# Patient Record
Sex: Female | Born: 1980 | Race: Black or African American | Hispanic: No | Marital: Single | Smoking: Light tobacco smoker
Health system: Southern US, Community
[De-identification: ages and names within clinical notes are randomized; demographics above are authoritative.]

## PROBLEM LIST (undated history)

## (undated) DIAGNOSIS — I10 Essential (primary) hypertension: Secondary | ICD-10-CM

## (undated) DIAGNOSIS — N6452 Nipple discharge: Secondary | ICD-10-CM

## (undated) HISTORY — PX: TUBAL LIGATION: SHX77

## (undated) HISTORY — PX: TONSILLECTOMY: SUR1361

---

## 2012-11-22 ENCOUNTER — Encounter (HOSPITAL_COMMUNITY): Payer: Self-pay | Admitting: Emergency Medicine

## 2012-11-22 ENCOUNTER — Emergency Department (HOSPITAL_COMMUNITY)
Admission: EM | Admit: 2012-11-22 | Discharge: 2012-11-22 | Disposition: A | Discharge status: Home / Self Care | Payer: Medicaid Other | Attending: Emergency Medicine | Admitting: Emergency Medicine

## 2012-11-22 DIAGNOSIS — F17200 Nicotine dependence, unspecified, uncomplicated: Secondary | ICD-10-CM | POA: Insufficient documentation

## 2012-11-22 DIAGNOSIS — R05 Cough: Secondary | ICD-10-CM | POA: Insufficient documentation

## 2012-11-22 DIAGNOSIS — R059 Cough, unspecified: Secondary | ICD-10-CM | POA: Insufficient documentation

## 2012-11-22 DIAGNOSIS — R220 Localized swelling, mass and lump, head: Secondary | ICD-10-CM | POA: Insufficient documentation

## 2012-11-22 DIAGNOSIS — R040 Epistaxis: Secondary | ICD-10-CM | POA: Insufficient documentation

## 2012-11-22 DIAGNOSIS — J029 Acute pharyngitis, unspecified: Secondary | ICD-10-CM | POA: Insufficient documentation

## 2012-11-22 DIAGNOSIS — I10 Essential (primary) hypertension: Secondary | ICD-10-CM | POA: Insufficient documentation

## 2012-11-22 DIAGNOSIS — J069 Acute upper respiratory infection, unspecified: Secondary | ICD-10-CM | POA: Insufficient documentation

## 2012-11-22 DIAGNOSIS — J3489 Other specified disorders of nose and nasal sinuses: Secondary | ICD-10-CM | POA: Insufficient documentation

## 2012-11-22 MED ORDER — SALINE SPRAY 0.65 % NA SOLN: 1.0000 | NASAL | Status: AC | PRN

## 2012-11-22 MED ORDER — OXYMETAZOLINE HCL 0.05 % NA SOLN: NASAL | Status: AC

## 2012-11-22 MED ORDER — OXYMETAZOLINE HCL 0.05 % NA SOLN
1.0000 | Freq: Once | NASAL | Status: AC
Start: 2012-11-22 — End: 2012-11-22
  Administered 2012-11-22: 1 via NASAL
  Filled 2012-11-22: qty 15

## 2012-11-22 MED ORDER — COCAINE HCL 4 % EX SOLN
4.0000 mL | Freq: Once | CUTANEOUS | Status: DC
  Filled 2012-11-22: qty 4

## 2012-11-22 MED ORDER — HYDROCODONE-HOMATROPINE 5-1.5 MG/5ML PO SYRP: 2.5000 mL | ORAL_SOLUTION | Freq: Four times a day (QID) | ORAL | Status: AC | PRN

## 2012-11-22 NOTE — ED Notes (Signed)
Pt states she has been sick for the past 3 days and has been having nose bleeds. 1 today right before coming to ER and 3 yesterday.

## 2012-11-22 NOTE — ED Provider Notes (Signed)
CSN: [id number]     Arrival date & time 11/22/12  1200 History   First MD Initiated Contact with Patient 11/22/12 1216     Chief Complaint  Patient presents with  . Nasal Congestion  . Epistaxis   (Consider location/radiation/quality/duration/timing/severity/associated sxs/prior Treatment) HPI 32 year old female presents to the emergency department chief complaint of upper respiratory infection and epistaxis.  The patient states that she has had a URI over the past 3 days with symptoms of eyes, productive cough, sore throat.  Patient has been taking Robitussin-DM and TheraFlu at home.  She had 3 episodes of epistaxis yesterday which resolved with pressure.  This morning the patient woke up and had another nosebleed which did not resolve at home with pressure prompted her visit to the emergency department.  Patient has a history of epistaxis as a child.  She does note some bruising on her skin but denies heavy bleeding with her periods.  She does not have a history of coagulation disorder.  Patient denies sinus tenderness but does have pressure in her nasal cavity.  History reviewed. No pertinent past medical history. History reviewed. No pertinent past surgical history. No family history on file. History  Substance Use Topics  . Smoking status: Current Every Day Smoker  . Smokeless tobacco: Not on file  . Alcohol Use: No   OB History   Grav Para Term Preterm Abortions TAB SAB Ect Mult Living                 Review of Systems  Constitutional: Negative for fever and chills.  HENT: Positive for congestion, sore throat, facial swelling and rhinorrhea. Negative for trouble swallowing and voice change.   Respiratory: Positive for cough. Negative for chest tightness and shortness of breath.   Cardiovascular: Negative for chest pain.  Gastrointestinal: Negative for nausea, vomiting, abdominal pain, diarrhea and constipation.  Genitourinary: Negative for dysuria and hematuria.   Musculoskeletal: Negative for myalgias and arthralgias.  Skin: Negative for rash.  Neurological: Negative for numbness.  Hematological:       Epistaxis  All other systems reviewed and are negative.    Allergies  Review of patient's allergies indicates no known allergies.  Home Medications   Current Outpatient Rx  Name  Route  Sig  Dispense  Refill  . guaiFENesin-dextromethorphan (ROBITUSSIN DM) 100-10 MG/5ML syrup   Oral   Take 5 mLs by mouth 3 (three) times daily as needed for cough.         . Pheniramine-PE-APAP (THERAFLU FLU & SORE THROAT) 20-10-650 MG PACK   Oral   Take 1 packet by mouth every 6 (six) hours as needed (cold symptoms).         Marland Kitchen HYDROcodone-homatropine (HYCODAN) 5-1.5 MG/5ML syrup   Oral   Take 2.5 mLs by mouth every 6 (six) hours as needed for cough.   30 mL   0   . oxymetazoline (AFRIN NASAL SPRAY) 0.05 % nasal spray      Please 2 sprays in each nostril twice a day for the next 2 days.  Discontinue the use after that.   15 mL   0   . sodium chloride (OCEAN) 0.65 % SOLN nasal spray   Nasal   Place 1 spray into the nose as needed for congestion.   15 mL   0    BP 134/100  Pulse 76  Temp(Src) 98.6 F (37 C) (Oral)  Resp 20  SpO2 99%  LMP 11/16/2012 Physical Exam Appears moderately ill but  not toxic; temperature as noted in vitals. Ears normal. Eyes:glassy appearance, no discharge  Heart: RRR, NO M/G/R Throat and pharynx normal.   Neck supple. No adenopathyhy in the neck.  Nose with red clots.  She has multiple dilated vasculature in the nasal cavity. No active bleeding.  Sinuses non tender.  The chest is clear. Abdomen is soft and nontender  ED Course  Procedures (including critical care time) Labs Review Labs Reviewed - No data to display Imaging Review No results found.  MDM   1. URI (upper respiratory infection)   2. Epistaxis   3. HTN (hypertension)    Patient here with hypertension, epistaxis and symptoms of URI.   She is afebrile and denies fevers at home.  The patient initially had resolution of her epistaxis however I did discharge began bleeding again.  I didn't use 2 sprays of Afrin in each nasal cavity with another 20 minutes of apply pressure.  Patient again had resolution of her symptoms however she blew her nose was a large clot and then began bleeding again.  At this point I had Dr. Jonny Ruiz knapp, and evaluate the patient.  She continues to have bleeding and no bruising she denied knowing what site her bleeding was calm being from states he thinks it's from the left side now. I placed 4% cocaine solution in each nostril and will reevaluate the patient after which she will get rapid Rhino placement in the nose for resolution of her epistaxis.   The patient did have resolution of her epistaxis with the cocaine solution in the nasal cavity.  I did attempt to place a rapid Rhino posterior nasal catheter however was unable to successfully Foley moved the catheter to the posterior pharynx I assume because of swelling in the nasal cavity.  I did ask the patient if he did try secondarily however she stated that she did not want to attempt this again.  The patient will be discharged home with after N.  She should follow up with the ear nose and throat doctor regarding her epistaxis.  I discussed return precautions for the patient.  Date of discharge  Arthor Captain, PA-C 11/22/12 1542

## 2012-11-22 NOTE — ED Provider Notes (Signed)
Medical screening examination/treatment/procedure(s) were performed by non-physician practitioner and as supervising physician I was immediately available for consultation/collaboration.    Celene Kras, MD 11/22/12 [phone]

## 2015-08-31 ENCOUNTER — Other Ambulatory Visit: Payer: Self-pay

## 2015-08-31 DIAGNOSIS — J020 Streptococcal pharyngitis: Secondary | ICD-10-CM | POA: Diagnosis not present

## 2015-08-31 DIAGNOSIS — F17200 Nicotine dependence, unspecified, uncomplicated: Secondary | ICD-10-CM | POA: Insufficient documentation

## 2015-08-31 DIAGNOSIS — R000 Tachycardia, unspecified: Secondary | ICD-10-CM | POA: Diagnosis not present

## 2015-08-31 DIAGNOSIS — J029 Acute pharyngitis, unspecified: Secondary | ICD-10-CM | POA: Diagnosis present

## 2015-08-31 DIAGNOSIS — M25511 Pain in right shoulder: Secondary | ICD-10-CM | POA: Diagnosis not present

## 2015-09-01 ENCOUNTER — Encounter (HOSPITAL_COMMUNITY): Payer: Self-pay

## 2015-09-01 ENCOUNTER — Emergency Department (HOSPITAL_COMMUNITY)
Admission: EM | Admit: 2015-09-01 | Discharge: 2015-09-01 | Disposition: A | Discharge status: Home / Self Care | Payer: 59 | Attending: Emergency Medicine | Admitting: Emergency Medicine

## 2015-09-01 DIAGNOSIS — J020 Streptococcal pharyngitis: Secondary | ICD-10-CM

## 2015-09-01 LAB — RAPID STREP SCREEN (MED CTR MEBANE ONLY): STREPTOCOCCUS, GROUP A SCREEN (DIRECT): POSITIVE — AB

## 2015-09-01 MED ORDER — PENICILLIN G BENZATHINE 1200000 UNIT/2ML IM SUSP
1.2000 10*6.[IU] | Freq: Once | INTRAMUSCULAR | Status: AC
  Administered 2015-09-01: 1.2 10*6.[IU] via INTRAMUSCULAR
  Filled 2015-09-01: qty 2

## 2015-09-01 MED ORDER — NAPROXEN 250 MG PO TABS
500.0000 mg | ORAL_TABLET | Freq: Once | ORAL | Status: AC
  Administered 2015-09-01: 500 mg via ORAL
  Filled 2015-09-01: qty 2

## 2015-09-01 MED ORDER — IBUPROFEN 600 MG PO TABS: 600.0000 mg | ORAL_TABLET | Freq: Four times a day (QID) | ORAL | Status: AC | PRN

## 2015-09-01 MED ORDER — MAGIC MOUTHWASH: 5.0000 mL | Freq: Three times a day (TID) | ORAL | Status: AC | PRN

## 2015-09-01 NOTE — ED Notes (Signed)
THE PT IS HYPERVENTILATING   AND BREATHING 30 TIMES A MINUTE.  SHE IS NOT ANSWERING QUESTIONS.  THE FEMALE WITH HER IS ANSWERING FOR HER    She nodes or shakes her head to yes and no questions..  When asked if she had panic attacks or anxiety  She shook her head no  But the female with her nodded to yes.  The pt kept her eyes closed. She started not talking at dinner time.  C/o throat and shoulder pain rt leg pain

## 2015-09-01 NOTE — ED Provider Notes (Signed)
CSN: [id number]     Arrival date & time 08/31/15  2351 History  By signing my name below, I, [facility], attest that this documentation has been prepared under the direction and in the presence of Derwood Kaplan, MD. Electronically Signed: Randell Patient, ED Scribe. 09/01/2015. 3:12 AM.   Chief Complaint  Patient presents with  . Shortness of Breath  . Sore Throat    The history is provided by the patient and a significant other. No language interpreter was used.   HPI Comments: Andrea Chen is a 35 y.o. female who presents to the Emergency Department complaining of constant, moderate sore throat onset 23 hours ago. History is provided by a significant other and pt. Significant other reports that the pt complained of right ear pain, followed by right-sided neck pain, right shoulder pain. Pt reports that she woke with a sore throat followed by pain with swallowing. She reports one episode of left-sided CP only - otherwise her pain has been on the right side of the chest and shoulder region and the episodes last for five minutes. Pain is unchanged with exertion, breathing. She denies recent sick contact but notes that she does have three children, one of which had strep throat 1 month ago. Denies hx of DM, DVT, and PE.  Denies congestion, cough, fevers, or any other symptoms currently.  Significant other reports that the pt complained of heart palpitations. He states that that the pt became dizzy and fatigued after which she measured her BP noting that it was elevated. Denies hx of HTN or CAD. Denies similar symptoms in the past. Denies taking HTN medications. Denies any other symptoms currently other than those noted previously.  History reviewed. No pertinent past medical history. History reviewed. No pertinent past surgical history. History reviewed. No pertinent family history. Social History  Substance Use Topics  . Smoking status: Current Every Day Smoker  . Smokeless tobacco:  None  . Alcohol Use: No   OB History    No data available     Review of Systems A complete 10 system review of systems was obtained and all systems are negative except as noted in the HPI and PMH.    Allergies  Review of patient's allergies indicates no known allergies.  Home Medications   Prior to Admission medications   Medication Sig Start Date End Date Taking? Authorizing Provider  guaiFENesin-dextromethorphan (ROBITUSSIN DM) 100-10 MG/5ML syrup Take 5 mLs by mouth 3 (three) times daily as needed for cough.    Historical Provider, MD  HYDROcodone-homatropine (HYCODAN) 5-1.5 MG/5ML syrup Take 2.5 mLs by mouth every 6 (six) hours as needed for cough. 11/22/12   Arthor Captain, PA-C  ibuprofen (ADVIL,MOTRIN) 600 MG tablet Take 1 tablet (600 mg total) by mouth every 6 (six) hours as needed. 09/01/15   Derwood Kaplan, MD  magic mouthwash SOLN Take 5 mLs by mouth 3 (three) times daily as needed for mouth pain. 09/01/15   Derwood Kaplan, MD  oxymetazoline (AFRIN NASAL SPRAY) 0.05 % nasal spray Please 2 sprays in each nostril twice a day for the next 2 days.  Discontinue the use after that. 11/22/12   Abigail Harris, PA-C  Pheniramine-PE-APAP (THERAFLU FLU & SORE THROAT) 20-10-650 MG PACK Take 1 packet by mouth every 6 (six) hours as needed (cold symptoms).    Historical Provider, MD  sodium chloride (OCEAN) 0.65 % SOLN nasal spray Place 1 spray into the nose as needed for congestion. 11/22/12   Arthor Captain, PA-C   BP  141/97 mmHg  Pulse 96  Temp(Src) 100 F (37.8 C) (Oral)  Resp 18  SpO2 99%  LMP 08/18/2015 Physical Exam  Constitutional: She is oriented to person, place, and time. She appears well-developed and well-nourished. No distress.  HENT:  Head: Normocephalic and atraumatic.  Mouth/Throat: No trismus in the jaw. Posterior oropharyngeal edema and posterior oropharyngeal erythema present. No oropharyngeal exudate.  Right sided peritonsillar swelling with erythema. No exudate or  trismus.  Eyes: Conjunctivae are normal.  Neck: Normal range of motion.  Cardiovascular: Tachycardia present.  Exam reveals no friction rub.   No murmur heard. Pulmonary/Chest: Effort normal. No respiratory distress. She has no wheezes. She has no rhonchi. She has no rales.  Lungs CTA bilaterally.  Musculoskeletal: Normal range of motion.  Reproducible tenderness to the anterior and posterior shoulder joint. Shoulder ROM intact. No worsening of pain with internal or external rotation of right shoulder. No evidence of severe edema or warm to touch of right shoulder.  Neurological: She is alert and oriented to person, place, and time.  Skin: Skin is warm and dry.  Psychiatric: She has a normal mood and affect. Her behavior is normal.  Nursing note and vitals reviewed.   ED Course  Procedures   DIAGNOSTIC STUDIES: Oxygen Saturation is 97% on RA, normal by my interpretation.    COORDINATION OF CARE: 2:47 AM Discussed results of strep test. Will order antibiotics. Will prescribe ibuprofen. Advised pt to take ibuprofen as needed. Advised pt to return to the ED symptoms worsen. Discussed treatment plan with pt at bedside and pt agreed to plan.   Labs Review Labs Reviewed  RAPID STREP SCREEN ([facility]) - Abnormal; Notable for the following:    Streptococcus, Group A Screen (Direct) POSITIVE (*)    All other components within normal limits    I have personally reviewed and evaluated these lab results as part of my medical decision-making.    EKG Interpretation  Date/Time:  Sunday August 31 2015 23:56:43 EDT Ventricular Rate:  99 PR Interval:  160 QRS Duration: 96 QT Interval:  344 QTC Calculation: 441 R Axis:   22 Text Interpretation:  Normal sinus rhythm Nonspecific T wave abnormality Abnormal ECG No acute changes No old tracing to compare Confirmed by Rhunette CroftNANAVATI, MD, Janey GentaANKIT [phone] on 09/01/2015 3:15:36 AM       MDM   Final diagnoses:  Acute streptococcal pharyngitis    I  personally performed the services described in this documentation, which was scribed in my presence. The recorded information has been reviewed and is accurate.  Pt's main complain is sore throat and elevated BP.  Sore throat - pt has strep pharyngitis. Will give IM penicillin. She has peritonsillar swelling (she reported tonsillectomy). But there is no exudate, no stridor, drooling or trismus. No clinical concerns for PTA or deep space infection.  Pt also c/o HTN. She checked the BP because she was feeling dizzy and having shoulder and chest discomfort on the R side. She has a normal EKG. No clinical concerns for PNA. Shoulder ROM is normal.    Derwood KaplanAnkit Stephanne Greeley, MD 09/01/15 [phone]

## 2015-09-01 NOTE — ED Notes (Signed)
The pt has been ready for triafe  For 30 minutes  She wants some p;ain med now  Waiting for nanavati

## 2015-09-01 NOTE — Discharge Instructions (Signed)
You have strep throat - antibiotics given in the ER.  Please return to the ER if your symptoms worsen; you have increased pain, fevers, chills, inability to keep any medications down, difficulty breathing or drooling.   Pharyngitis Pharyngitis is redness, pain, and swelling (inflammation) of your pharynx.  CAUSES  Pharyngitis is usually caused by infection. Most of the time, these infections are from viruses (viral) and are part of a cold. However, sometimes pharyngitis is caused by bacteria (bacterial). Pharyngitis can also be caused by allergies. Viral pharyngitis may be spread from person to person by coughing, sneezing, and personal items or utensils (cups, forks, spoons, toothbrushes). Bacterial pharyngitis may be spread from person to person by more intimate contact, such as kissing.  SIGNS AND SYMPTOMS  Symptoms of pharyngitis include:   Sore throat.   Tiredness (fatigue).   Low-grade fever.   Headache.  Joint pain and muscle aches.  Skin rashes.  Swollen lymph nodes.  Plaque-like film on throat or tonsils (often seen with bacterial pharyngitis). DIAGNOSIS  Your health care provider will ask you questions about your illness and your symptoms. Your medical history, along with a physical exam, is often all that is needed to diagnose pharyngitis. Sometimes, a rapid strep test is done. Other lab tests may also be done, depending on the suspected cause.  TREATMENT  Viral pharyngitis will usually get better in 3-4 days without the use of medicine. Bacterial pharyngitis is treated with medicines that kill germs (antibiotics).  HOME CARE INSTRUCTIONS   Drink enough water and fluids to keep your urine clear or pale yellow.   Only take over-the-counter or prescription medicines as directed by your health care provider:   If you are prescribed antibiotics, make sure you finish them even if you start to feel better.   Do not take aspirin.   Get lots of rest.   Gargle  with 8 oz of salt water ( tsp of salt per 1 qt of water) as often as every 1-2 hours to soothe your throat.   Throat lozenges (if you are not at risk for choking) or sprays may be used to soothe your throat. SEEK MEDICAL CARE IF:   You have large, tender lumps in your neck.  You have a rash.  You cough up green, yellow-brown, or bloody spit. SEEK IMMEDIATE MEDICAL CARE IF:   Your neck becomes stiff.  You drool or are unable to swallow liquids.  You vomit or are unable to keep medicines or liquids down.  You have severe pain that does not go away with the use of recommended medicines.  You have trouble breathing (not caused by a stuffy nose). MAKE SURE YOU:   Understand these instructions.  Will watch your condition.  Will get help right away if you are not doing well or get worse.   This information is not intended to replace advice given to you by your health care provider. Make sure you discuss any questions you have with your health care provider.   Document Released: 03/01/2005 Document Revised: 12/20/2012 Document Reviewed: 11/06/2012 Elsevier Interactive Patient Education 2016 [employer]  Strep Throat Strep throat is a bacterial infection of the throat. Your health care provider may call the infection tonsillitis or pharyngitis, depending on whether there is swelling in the tonsils or at the back of the throat. Strep throat is most common during the cold months of the year in children who are 555-37 years of age, but it can happen during any season in  people of any age. This infection is spread from person to person (contagious) through coughing, sneezing, or close contact. CAUSES Strep throat is caused by the bacteria called Streptococcus pyogenes. RISK FACTORS This condition is more likely to develop in:  People who spend time in crowded places where the infection can spread easily.  People who have close contact with someone who has strep  throat. SYMPTOMS Symptoms of this condition include:  Fever or chills.   Redness, swelling, or pain in the tonsils or throat.  Pain or difficulty when swallowing.  White or yellow spots on the tonsils or throat.  Swollen, tender glands in the neck or under the jaw.  Red rash all over the body (rare). DIAGNOSIS This condition is diagnosed by performing a rapid strep test or by taking a swab of your throat (throat culture test). Results from a rapid strep test are usually ready in a few minutes, but throat culture test results are available after one or two days. TREATMENT This condition is treated with antibiotic medicine. HOME CARE INSTRUCTIONS Medicines  Take over-the-counter and prescription medicines only as told by your health care provider.  Take your antibiotic as told by your health care provider. Do not stop taking the antibiotic even if you start to feel better.  Have family members who also have a sore throat or fever tested for strep throat. They may need antibiotics if they have the strep infection. Eating and Drinking  Do not share food, drinking cups, or personal items that could cause the infection to spread to other people.  If swallowing is difficult, try eating soft foods until your sore throat feels better.  Drink enough fluid to keep your urine clear or pale yellow. General Instructions  Gargle with a salt-water mixture 3-4 times per day or as needed. To make a salt-water mixture, completely dissolve -1 tsp of salt in 1 cup of warm water.  Make sure that all household members wash their hands well.  Get plenty of rest.  Stay home from school or work until you have been taking antibiotics for 24 hours.  Keep all follow-up visits as told by your health care provider. This is important. SEEK MEDICAL CARE IF:  The glands in your neck continue to get bigger.  You develop a rash, cough, or earache.  You cough up a thick liquid that is green,  yellow-brown, or bloody.  You have pain or discomfort that does not get better with medicine.  Your problems seem to be getting worse rather than better.  You have a fever. SEEK IMMEDIATE MEDICAL CARE IF:  You have new symptoms, such as vomiting, severe headache, stiff or painful neck, chest pain, or shortness of breath.  You have severe throat pain, drooling, or changes in your voice.  You have swelling of the neck, or the skin on the neck becomes red and tender.  You have signs of dehydration, such as fatigue, dry mouth, and decreased urination.  You become increasingly sleepy, or you cannot wake up completely.  Your joints become red or painful.   This information is not intended to replace advice given to you by your health care provider. Make sure you discuss any questions you have with your health care provider.   Document Released: 02/27/2000 Document Revised: 11/20/2014 Document Reviewed: 06/24/2014 Elsevier Interactive Patient Education [employer]

## 2015-09-01 NOTE — ED Notes (Signed)
Pt complaining of SOB, throat and neck pain. Pt denies any cough or fevers. Pt states pain with swallowing.

## 2016-05-21 DIAGNOSIS — I10 Essential (primary) hypertension: Secondary | ICD-10-CM | POA: Diagnosis not present

## 2016-06-30 ENCOUNTER — Other Ambulatory Visit: Payer: Self-pay | Admitting: Physician Assistant

## 2016-06-30 ENCOUNTER — Other Ambulatory Visit (HOSPITAL_COMMUNITY)
Admission: RE | Admit: 2016-06-30 | Discharge: 2016-06-30 | Disposition: A | Discharge status: Home / Self Care | Payer: 59 | Source: Ambulatory Visit | Attending: Physician Assistant | Admitting: Physician Assistant

## 2016-06-30 DIAGNOSIS — I10 Essential (primary) hypertension: Secondary | ICD-10-CM | POA: Diagnosis not present

## 2016-06-30 DIAGNOSIS — Z0000 Encounter for general adult medical examination without abnormal findings: Secondary | ICD-10-CM | POA: Insufficient documentation

## 2016-07-02 LAB — CYTOLOGY - PAP
DIAGNOSIS: NEGATIVE
HPV (WINDOPATH): NOT DETECTED

## 2016-08-11 ENCOUNTER — Other Ambulatory Visit: Payer: Self-pay | Admitting: Family Medicine

## 2016-08-11 ENCOUNTER — Other Ambulatory Visit: Payer: 59

## 2016-08-11 DIAGNOSIS — N6452 Nipple discharge: Secondary | ICD-10-CM

## 2016-08-11 DIAGNOSIS — I10 Essential (primary) hypertension: Secondary | ICD-10-CM | POA: Diagnosis not present

## 2016-08-12 ENCOUNTER — Ambulatory Visit
Admission: RE | Admit: 2016-08-12 | Discharge: 2016-08-12 | Disposition: A | Discharge status: Home / Self Care | Payer: 59 | Source: Ambulatory Visit | Attending: Family Medicine | Admitting: Family Medicine

## 2016-08-12 DIAGNOSIS — N6452 Nipple discharge: Secondary | ICD-10-CM

## 2016-08-12 DIAGNOSIS — N6489 Other specified disorders of breast: Secondary | ICD-10-CM | POA: Diagnosis not present

## 2016-08-12 DIAGNOSIS — R928 Other abnormal and inconclusive findings on diagnostic imaging of breast: Secondary | ICD-10-CM | POA: Diagnosis not present

## 2016-08-12 HISTORY — DX: Nipple discharge: N64.52

## 2016-08-24 DIAGNOSIS — R0981 Nasal congestion: Secondary | ICD-10-CM | POA: Diagnosis not present

## 2016-08-25 DIAGNOSIS — I10 Essential (primary) hypertension: Secondary | ICD-10-CM | POA: Diagnosis not present

## 2016-11-29 DIAGNOSIS — J029 Acute pharyngitis, unspecified: Secondary | ICD-10-CM | POA: Diagnosis not present

## 2016-11-29 DIAGNOSIS — J069 Acute upper respiratory infection, unspecified: Secondary | ICD-10-CM | POA: Diagnosis not present

## 2016-11-29 DIAGNOSIS — R5383 Other fatigue: Secondary | ICD-10-CM | POA: Diagnosis not present

## 2017-01-07 DIAGNOSIS — R9431 Abnormal electrocardiogram [ECG] [EKG]: Secondary | ICD-10-CM | POA: Diagnosis not present

## 2017-01-07 DIAGNOSIS — I10 Essential (primary) hypertension: Secondary | ICD-10-CM | POA: Diagnosis not present

## 2017-02-15 ENCOUNTER — Other Ambulatory Visit: Payer: Self-pay | Admitting: Cardiology

## 2017-02-15 ENCOUNTER — Ambulatory Visit
Admission: RE | Admit: 2017-02-15 | Discharge: 2017-02-15 | Disposition: A | Discharge status: Home / Self Care | Payer: 59 | Source: Ambulatory Visit | Attending: Cardiology | Admitting: Cardiology

## 2017-02-15 DIAGNOSIS — R0789 Other chest pain: Secondary | ICD-10-CM | POA: Diagnosis not present

## 2017-02-15 DIAGNOSIS — R0602 Shortness of breath: Secondary | ICD-10-CM | POA: Diagnosis not present

## 2017-02-15 DIAGNOSIS — R9431 Abnormal electrocardiogram [ECG] [EKG]: Secondary | ICD-10-CM | POA: Diagnosis not present

## 2017-02-15 DIAGNOSIS — I10 Essential (primary) hypertension: Secondary | ICD-10-CM | POA: Diagnosis not present

## 2017-02-15 DIAGNOSIS — R079 Chest pain, unspecified: Secondary | ICD-10-CM | POA: Diagnosis not present

## 2017-02-18 DIAGNOSIS — I10 Essential (primary) hypertension: Secondary | ICD-10-CM | POA: Diagnosis not present

## 2017-03-01 DIAGNOSIS — R079 Chest pain, unspecified: Secondary | ICD-10-CM | POA: Diagnosis not present

## 2017-05-16 DIAGNOSIS — R51 Headache: Secondary | ICD-10-CM | POA: Diagnosis not present

## 2017-05-16 DIAGNOSIS — R52 Pain, unspecified: Secondary | ICD-10-CM | POA: Diagnosis not present

## 2017-09-23 DIAGNOSIS — G44209 Tension-type headache, unspecified, not intractable: Secondary | ICD-10-CM | POA: Diagnosis not present

## 2017-09-23 DIAGNOSIS — I10 Essential (primary) hypertension: Secondary | ICD-10-CM | POA: Diagnosis not present

## 2017-09-23 DIAGNOSIS — R079 Chest pain, unspecified: Secondary | ICD-10-CM | POA: Diagnosis not present

## 2017-09-28 DIAGNOSIS — I10 Essential (primary) hypertension: Secondary | ICD-10-CM | POA: Diagnosis not present

## 2017-12-04 DIAGNOSIS — Z3202 Encounter for pregnancy test, result negative: Secondary | ICD-10-CM | POA: Diagnosis not present

## 2017-12-04 DIAGNOSIS — I10 Essential (primary) hypertension: Secondary | ICD-10-CM | POA: Diagnosis not present

## 2017-12-04 DIAGNOSIS — R42 Dizziness and giddiness: Secondary | ICD-10-CM | POA: Diagnosis not present

## 2017-12-04 DIAGNOSIS — R51 Headache: Secondary | ICD-10-CM | POA: Diagnosis not present

## 2018-03-22 DIAGNOSIS — I10 Essential (primary) hypertension: Secondary | ICD-10-CM | POA: Diagnosis not present

## 2018-06-02 DIAGNOSIS — G4700 Insomnia, unspecified: Secondary | ICD-10-CM | POA: Diagnosis not present

## 2018-06-02 DIAGNOSIS — I10 Essential (primary) hypertension: Secondary | ICD-10-CM | POA: Diagnosis not present

## 2018-07-25 DIAGNOSIS — G43909 Migraine, unspecified, not intractable, without status migrainosus: Secondary | ICD-10-CM | POA: Diagnosis not present

## 2018-07-25 DIAGNOSIS — I10 Essential (primary) hypertension: Secondary | ICD-10-CM | POA: Diagnosis not present

## 2019-01-12 ENCOUNTER — Other Ambulatory Visit: Payer: Self-pay

## 2019-01-12 ENCOUNTER — Emergency Department (HOSPITAL_BASED_OUTPATIENT_CLINIC_OR_DEPARTMENT_OTHER)
Admission: EM | Admit: 2019-01-12 | Discharge: 2019-01-13 | Disposition: A | Discharge status: Home / Self Care | Payer: 59 | Attending: Emergency Medicine | Admitting: Emergency Medicine

## 2019-01-12 ENCOUNTER — Emergency Department (HOSPITAL_BASED_OUTPATIENT_CLINIC_OR_DEPARTMENT_OTHER): Payer: 59

## 2019-01-12 ENCOUNTER — Encounter (HOSPITAL_BASED_OUTPATIENT_CLINIC_OR_DEPARTMENT_OTHER): Payer: Self-pay | Admitting: *Deleted

## 2019-01-12 DIAGNOSIS — Z79899 Other long term (current) drug therapy: Secondary | ICD-10-CM | POA: Insufficient documentation

## 2019-01-12 DIAGNOSIS — I10 Essential (primary) hypertension: Secondary | ICD-10-CM | POA: Insufficient documentation

## 2019-01-12 DIAGNOSIS — Z3202 Encounter for pregnancy test, result negative: Secondary | ICD-10-CM | POA: Diagnosis not present

## 2019-01-12 DIAGNOSIS — R0789 Other chest pain: Secondary | ICD-10-CM

## 2019-01-12 DIAGNOSIS — I951 Orthostatic hypotension: Secondary | ICD-10-CM

## 2019-01-12 DIAGNOSIS — F17200 Nicotine dependence, unspecified, uncomplicated: Secondary | ICD-10-CM | POA: Diagnosis not present

## 2019-01-12 HISTORY — DX: Essential (primary) hypertension: I10

## 2019-01-12 LAB — CBC
HCT: 40.9 % (ref 36.0–46.0)
Hemoglobin: 13.2 g/dL (ref 12.0–15.0)
MCH: 28 pg (ref 26.0–34.0)
MCHC: 32.3 g/dL (ref 30.0–36.0)
MCV: 86.7 fL (ref 80.0–100.0)
Platelets: 328 10*3/uL (ref 150–400)
RBC: 4.72 MIL/uL (ref 3.87–5.11)
RDW: 13.6 % (ref 11.5–15.5)
WBC: 9.8 10*3/uL (ref 4.0–10.5)
nRBC: 0 % (ref 0.0–0.2)

## 2019-01-12 LAB — BASIC METABOLIC PANEL
Anion gap: 8 (ref 5–15)
BUN: 10 mg/dL (ref 6–20)
CO2: 22 mmol/L (ref 22–32)
Calcium: 9.2 mg/dL (ref 8.9–10.3)
Chloride: 106 mmol/L (ref 98–111)
Creatinine, Ser: 0.82 mg/dL (ref 0.44–1.00)
GFR calc Af Amer: >60 mL/min (ref >60)
GFR calc non Af Amer: >60 mL/min (ref >60)
Glucose, Bld: 77 mg/dL (ref 70–99)
Potassium: 3.7 mmol/L (ref 3.5–5.1)
Sodium: 136 mmol/L (ref 135–145)

## 2019-01-12 LAB — PREGNANCY, URINE: Preg Test, Ur: NEGATIVE

## 2019-01-12 LAB — TROPONIN I (HIGH SENSITIVITY)
Troponin I (High Sensitivity): 3 ng/L (ref <18)
Troponin I (High Sensitivity): 2 ng/L (ref <18)

## 2019-01-12 LAB — D-DIMER, QUANTITATIVE: D-Dimer, Quant: 0.45 ug/mL-FEU (ref 0.00–0.50)

## 2019-01-12 MED ORDER — SODIUM CHLORIDE 0.9 % IV BOLUS: 1000.0000 mL | Freq: Once | INTRAVENOUS | Status: DC

## 2019-01-12 MED ORDER — KETOROLAC TROMETHAMINE 15 MG/ML IJ SOLN
15.0000 mg | Freq: Once | INTRAMUSCULAR | Status: AC
  Administered 2019-01-12: 15 mg via INTRAMUSCULAR
  Filled 2019-01-12: qty 1

## 2019-01-12 NOTE — ED Provider Notes (Signed)
[facility] EMERGENCY DEPARTMENT Provider Note   CSN: [id number] Arrival date & time: 01/12/19  2025     History   Chief Complaint Chief Complaint  Patient presents with   Chest Pain    HPI Andrea Chen is a 38 y.o. female with history of hypertension who presents with chest pain that has been present for the past 3 days.  She describes it as sharp.  It does not radiate.  She denies any fevers, chest pain, shortness of breath, abdominal pain, nausea, vomiting.  Patient reports a trip to [place] 2 months ago.  She has had some swelling in her leg since she was diagnosed with hypertension a long time ago, but no leg pain or swelling.  She denies any exogenous estrogen use or history of blood clots.  She has had some mild lightheadedness recently with standing that is intermittent.  Gets better when she stands and starts walking.     HPI  Past Medical History:  Diagnosis Date   Breast discharge    bil on pe   Hypertension     There are no active problems to display for this patient.   Past Surgical History:  Procedure Laterality Date   TUBAL LIGATION       OB History   No obstetric history on file.      Home Medications    Prior to Admission medications   Medication Sig Start Date End Date Taking? Authorizing Provider  amLODipine (NORVASC) 10 MG tablet Take 10 mg by mouth daily. 09/21/18  Yes [provider]  hydrochlorothiazide (HYDRODIURIL) 25 MG tablet Take 25 mg by mouth every morning. 09/21/18  Yes [provider]  guaiFENesin-dextromethorphan (ROBITUSSIN DM) 100-10 MG/5ML syrup Take 5 mLs by mouth 3 (three) times daily as needed for cough.    [provider]  HYDROcodone-homatropine (HYCODAN) 5-1.5 MG/5ML syrup Take 2.5 mLs by mouth every 6 (six) hours as needed for cough. 11/22/12   Harris, Cammy Copa, PA-C  ibuprofen (ADVIL,MOTRIN) 600 MG tablet Take 1 tablet (600 mg total) by mouth every 6 (six) hours as needed. 09/01/15    Derwood Kaplan, MD  magic mouthwash SOLN Take 5 mLs by mouth 3 (three) times daily as needed for mouth pain. 09/01/15   Derwood Kaplan, MD  oxymetazoline (AFRIN NASAL SPRAY) 0.05 % nasal spray Please 2 sprays in each nostril twice a day for the next 2 days.  Discontinue the use after that. 11/22/12   Harris, Abigail, PA-C  Pheniramine-PE-APAP (THERAFLU FLU & SORE THROAT) 20-10-650 MG PACK Take 1 packet by mouth every 6 (six) hours as needed (cold symptoms).    [provider]  sodium chloride (OCEAN) 0.65 % SOLN nasal spray Place 1 spray into the nose as needed for congestion. 11/22/12   Arthor Captain, PA-C    Family History Family History  Problem Relation Age of Onset   Breast cancer Maternal Grandmother    Breast cancer Cousin     Social History Social History   Tobacco Use   Smoking status: Current Every Day Smoker   Smokeless tobacco: Never Used  Substance Use Topics   Alcohol use: No   Drug use: Never     Allergies   Patient has no known allergies.   Review of Systems Review of Systems  Constitutional: Negative for chills and fever.  HENT: Negative for facial swelling and sore throat.   Respiratory: Negative for shortness of breath.   Cardiovascular: Positive for chest pain.  Gastrointestinal: Negative for  abdominal pain, nausea and vomiting.  Genitourinary: Negative for dysuria.  Musculoskeletal: Negative for back pain.  Skin: Negative for rash and wound.  Neurological: Negative for headaches.  Psychiatric/Behavioral: The patient is not nervous/anxious.      Physical Exam Updated Vital Signs BP (!) 154/99    Pulse (!) 57    Temp 98.7 F (37.1 C) (Oral)    Resp 16    Ht 5\' 3"  (1.6 m)    Wt 107 kg    LMP 12/19/2018    SpO2 100%    BMI 41.81 kg/m   Physical Exam Vitals signs and nursing note reviewed.  Constitutional:      General: She is not in acute distress.    Appearance: She is well-developed. She is not diaphoretic.  HENT:     Head:  Normocephalic and atraumatic.     Mouth/Throat:     Pharynx: No oropharyngeal exudate.  Eyes:     General: No scleral icterus.       Right eye: No discharge.        Left eye: No discharge.     Conjunctiva/sclera: Conjunctivae normal.     Pupils: Pupils are equal, round, and reactive to light.  Neck:     Musculoskeletal: Normal range of motion and neck supple.     Thyroid: No thyromegaly.  Cardiovascular:     Rate and Rhythm: Normal rate and regular rhythm.     Heart sounds: Normal heart sounds. No murmur. No friction rub. No gallop.   Pulmonary:     Effort: Pulmonary effort is normal. No respiratory distress.     Breath sounds: Normal breath sounds. No stridor. No wheezing or rales.  Chest:     Chest wall: Tenderness present.    Abdominal:     General: Bowel sounds are normal. There is no distension.     Palpations: Abdomen is soft.     Tenderness: There is no abdominal tenderness. There is no guarding or rebound.  Musculoskeletal:     Right lower leg: She exhibits no tenderness. No edema.     Left lower leg: She exhibits no tenderness. No edema.  Lymphadenopathy:     Cervical: No cervical adenopathy.  Skin:    General: Skin is warm and dry.     Coloration: Skin is not pale.     Findings: No rash.  Neurological:     Mental Status: She is alert.     Coordination: Coordination normal.      ED Treatments / Results  Labs (all labs ordered are listed, but only abnormal results are displayed) Labs Reviewed  BASIC METABOLIC PANEL  CBC  PREGNANCY, URINE  D-DIMER, QUANTITATIVE (NOT AT [facility])  TROPONIN I (HIGH SENSITIVITY)  TROPONIN I (HIGH SENSITIVITY)    EKG EKG Interpretation  Date/Time:  Friday January 12 2019 20:33:25 EDT Ventricular Rate:  63 PR Interval:  178 QRS Duration: 90 QT Interval:  390 QTC Calculation: 399 R Axis:   3 Text Interpretation: Normal sinus rhythm Cannot rule out Anterior infarct , age undetermined Abnormal ECG Confirmed by Quintella Reichert [phone]) on 01/12/2019 9:03:44 PM   Radiology Dg Chest 2 View  Result Date: 01/12/2019 CLINICAL DATA:  Chest pain EXAM: CHEST - 2 VIEW COMPARISON:  Radiograph 02/15/2017 FINDINGS: No consolidation, features of edema, pneumothorax, or effusion. Pulmonary vascularity is normally distributed. The cardiomediastinal contours are unremarkable. No acute osseous or soft tissue abnormality. IMPRESSION: No acute cardiopulmonary abnormality. Electronically Signed   By: Elwin Sleight.D.  On: 01/12/2019 21:07    Procedures Procedures (including critical care time)  Medications Ordered in ED Medications  sodium chloride 0.9 % bolus 1,000 mL (has no administration in time range)  ketorolac (TORADOL) 15 MG/ML injection 15 mg (15 mg Intramuscular Given 01/12/19 2320)     Initial Impression / Assessment and Plan / ED Course  I have reviewed the triage vital signs and the nursing notes.  Pertinent labs & imaging results that were available during my care of the patient were reviewed by me and considered in my medical decision making (see chart for details).        Patient presenting with chest pain.  It is reproducible on palpation.  Suspect chest wall pain.  Initial troponin is negative.  CBC, BMP unremarkable.  Chest x-ray is clear.  EKG shows NSR.  Second troponin is negative as well as d-dimer. Orthostatic vitals signs are positive.  Patient lightheaded with standing.  Patient really does not want an IV.  Oral fluids encouraged and reassess. At shift change, patient care transferred to Dr. Nicanor AlconPalumbo for continued evaluation, follow up of hydration and symptomatic and determination of disposition. Anticipate discharge with NSAIDs for chest wall pain.   Final Clinical Impressions(s) / ED Diagnoses   Final diagnoses:  Chest wall pain  Orthostatic hypotension    ED Discharge Orders    None       Emi HolesLaw, Pierre Cumpton M, PA-C 01/13/19 0002    Tilden Fossaees, Elizabeth, MD 01/13/19 [id number]

## 2019-01-12 NOTE — ED Triage Notes (Signed)
2 days of headache and dizziness. Hx of HTN. Pain in her left chest that is sharp. No hx of PE. No SOB. She was seen at [facility] and told to come here for further cardiac evaluation after her EKG was abnormal.

## 2019-01-12 NOTE — ED Notes (Signed)
Patient given several cups of water for PO hydration.

## 2019-01-13 NOTE — ED Notes (Signed)
Pt was given 5 cups of water

## 2019-11-17 ENCOUNTER — Encounter (HOSPITAL_BASED_OUTPATIENT_CLINIC_OR_DEPARTMENT_OTHER): Payer: Self-pay

## 2019-11-17 ENCOUNTER — Emergency Department (HOSPITAL_BASED_OUTPATIENT_CLINIC_OR_DEPARTMENT_OTHER)
Admission: EM | Admit: 2019-11-17 | Discharge: 2019-11-17 | Disposition: A | Discharge status: Home / Self Care | Payer: 59 | Attending: Emergency Medicine | Admitting: Emergency Medicine

## 2019-11-17 ENCOUNTER — Other Ambulatory Visit: Payer: Self-pay

## 2019-11-17 DIAGNOSIS — Z5321 Procedure and treatment not carried out due to patient leaving prior to being seen by health care provider: Secondary | ICD-10-CM | POA: Diagnosis not present

## 2019-11-17 DIAGNOSIS — I169 Hypertensive crisis, unspecified: Secondary | ICD-10-CM | POA: Diagnosis not present

## 2019-11-17 NOTE — ED Triage Notes (Signed)
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

## 2021-08-09 IMAGING — CR DG CHEST 2V
2 series · 2 of 2 positions shown · non-contrast
Comparison: Radiograph 02/15/2017

CLINICAL DATA: Chest pain

EXAM:
CHEST - 2 VIEW

[w chest pa]
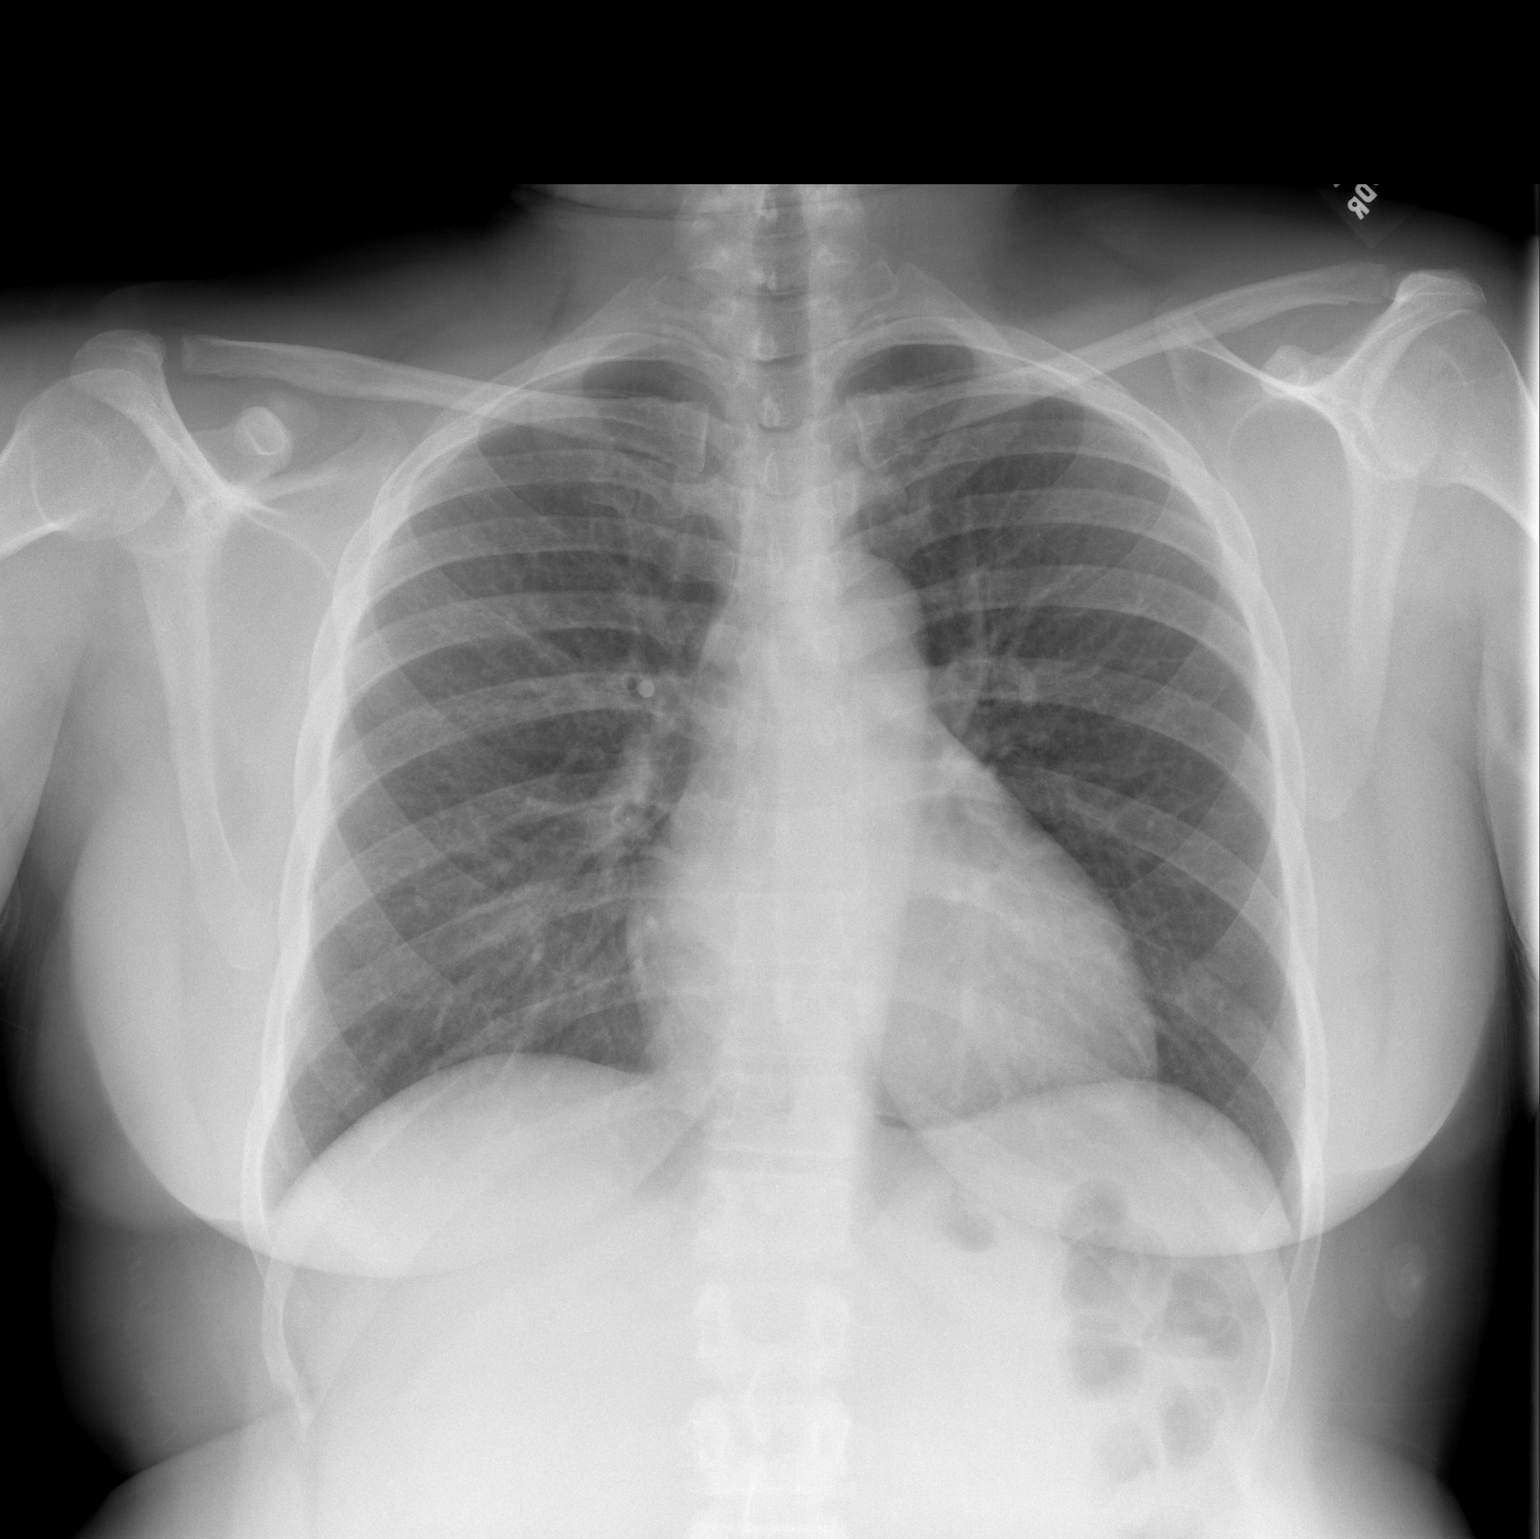

[w chest lat]
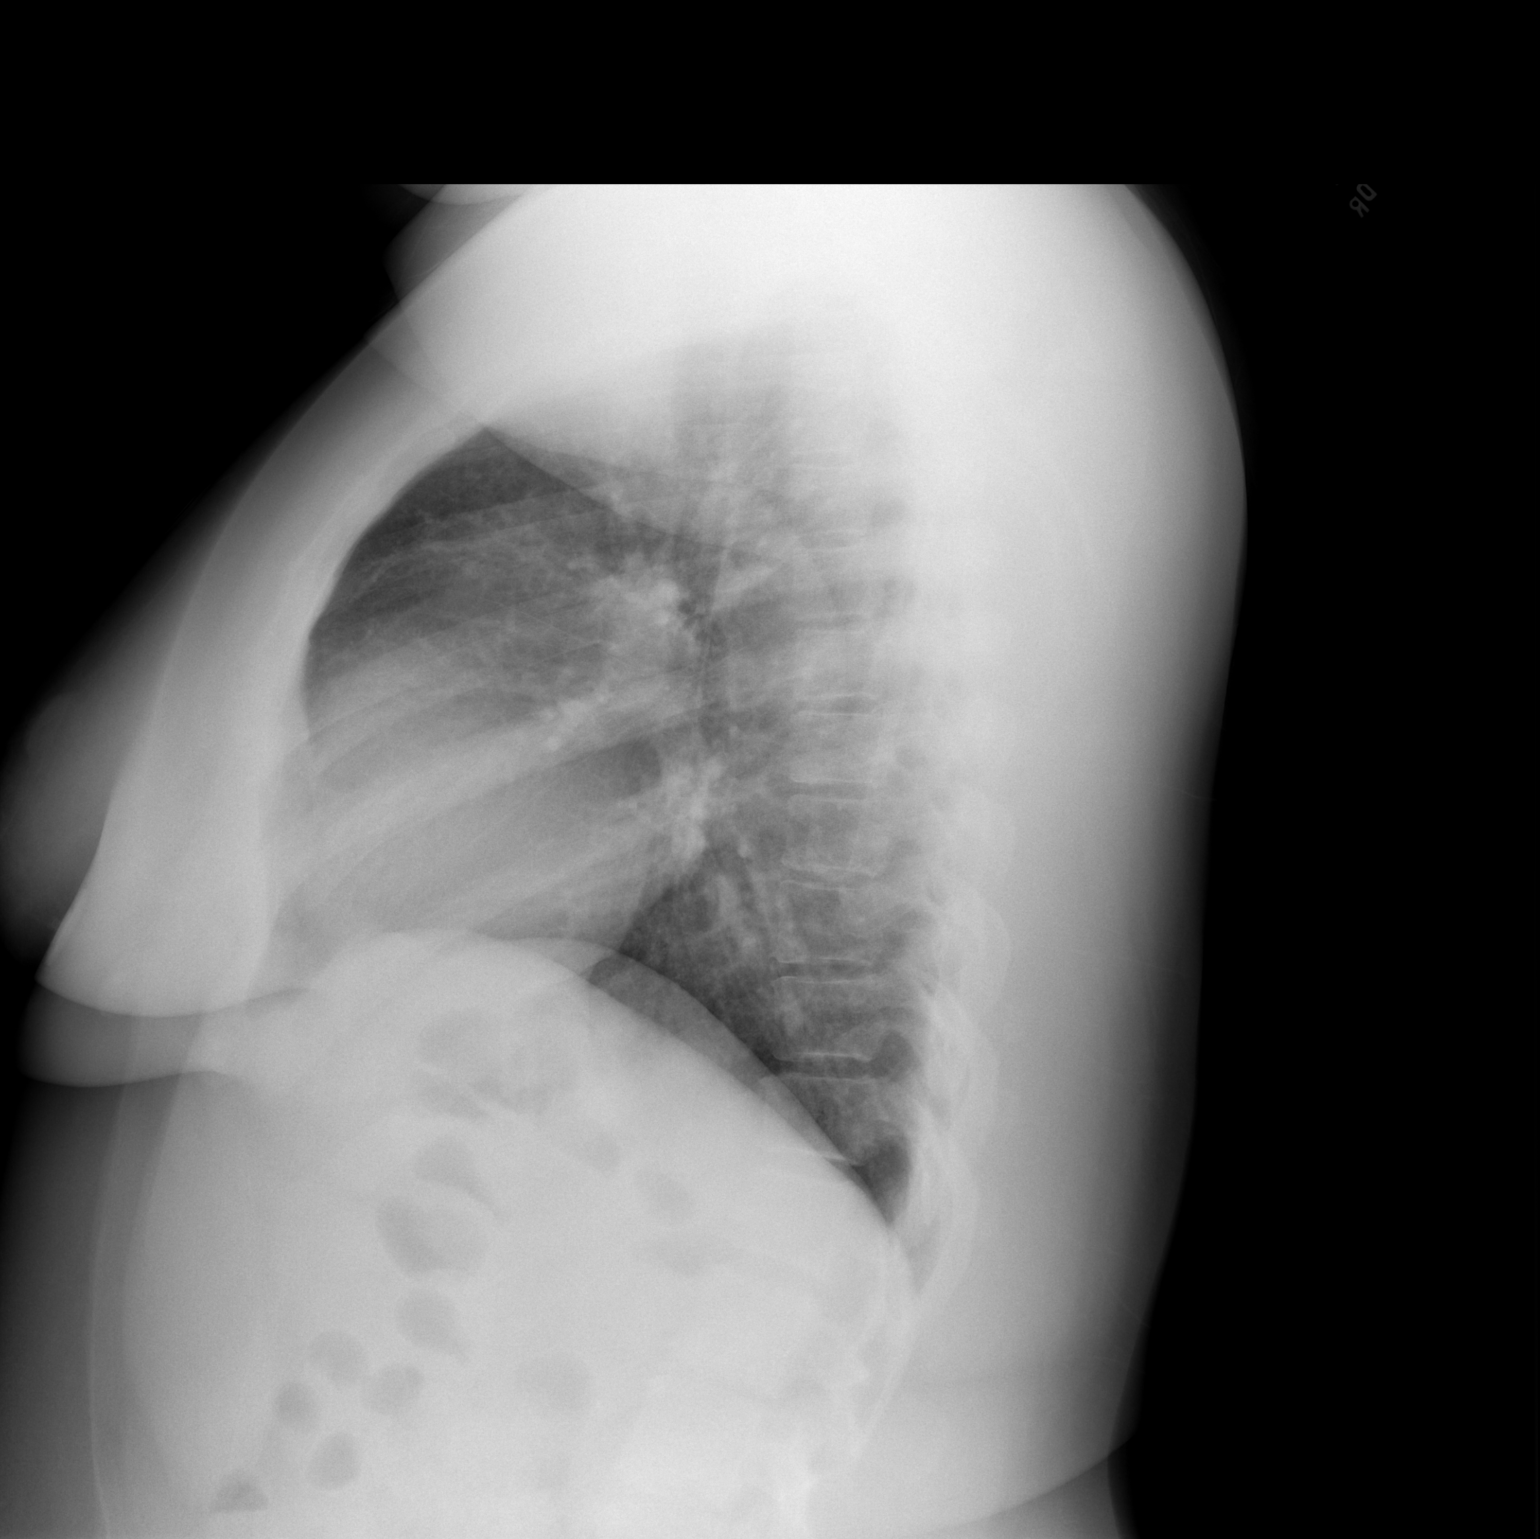

[2 of 2 positions shown; findings below may reference images not displayed]

FINDINGS: No consolidation, features of edema, pneumothorax, or effusion.
Pulmonary vascularity is normally distributed. The cardiomediastinal
contours are unremarkable. No acute osseous or soft tissue
abnormality.
IMPRESSION: No acute cardiopulmonary abnormality.

## 2022-01-28 ENCOUNTER — Encounter (HOSPITAL_BASED_OUTPATIENT_CLINIC_OR_DEPARTMENT_OTHER): Payer: Self-pay | Admitting: Emergency Medicine

## 2022-01-28 ENCOUNTER — Other Ambulatory Visit: Payer: Self-pay

## 2022-01-28 ENCOUNTER — Emergency Department (HOSPITAL_BASED_OUTPATIENT_CLINIC_OR_DEPARTMENT_OTHER)
Admission: EM | Admit: 2022-01-28 | Discharge: 2022-01-28 | Disposition: A | Discharge status: Home / Self Care | Payer: No Typology Code available for payment source | Attending: Emergency Medicine | Admitting: Emergency Medicine

## 2022-01-28 ENCOUNTER — Emergency Department (HOSPITAL_BASED_OUTPATIENT_CLINIC_OR_DEPARTMENT_OTHER): Payer: 59

## 2022-01-28 DIAGNOSIS — M79642 Pain in left hand: Secondary | ICD-10-CM | POA: Diagnosis not present

## 2022-01-28 DIAGNOSIS — S1091XA Abrasion of unspecified part of neck, initial encounter: Secondary | ICD-10-CM | POA: Insufficient documentation

## 2022-01-28 DIAGNOSIS — Z23 Encounter for immunization: Secondary | ICD-10-CM | POA: Insufficient documentation

## 2022-01-28 DIAGNOSIS — Y92149 Unspecified place in prison as the place of occurrence of the external cause: Secondary | ICD-10-CM | POA: Insufficient documentation

## 2022-01-28 DIAGNOSIS — I10 Essential (primary) hypertension: Secondary | ICD-10-CM | POA: Insufficient documentation

## 2022-01-28 DIAGNOSIS — Z79899 Other long term (current) drug therapy: Secondary | ICD-10-CM | POA: Insufficient documentation

## 2022-01-28 DIAGNOSIS — S199XXA Unspecified injury of neck, initial encounter: Secondary | ICD-10-CM | POA: Diagnosis present

## 2022-01-28 DIAGNOSIS — Y990 Civilian activity done for income or pay: Secondary | ICD-10-CM | POA: Diagnosis not present

## 2022-01-28 DIAGNOSIS — S60222A Contusion of left hand, initial encounter: Secondary | ICD-10-CM

## 2022-01-28 MED ORDER — TETANUS-DIPHTH-ACELL PERTUSSIS 5-2.5-18.5 LF-MCG/0.5 IM SUSY
0.5000 mL | PREFILLED_SYRINGE | Freq: Once | INTRAMUSCULAR | Status: AC
  Administered 2022-01-28: 0.5 mL via INTRAMUSCULAR
  Filled 2022-01-28: qty 0.5

## 2022-01-28 NOTE — Discharge Instructions (Signed)
Ice for 20 minutes every 2 hours while awake for the next 2 days.  Take ibuprofen 600 mg every 6 hours as needed for pain.  Follow-up with primary doctor if not improving in the next week.
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
## 2023-02-16 ENCOUNTER — Other Ambulatory Visit: Payer: Self-pay | Admitting: Family Medicine

## 2023-02-16 DIAGNOSIS — Z1231 Encounter for screening mammogram for malignant neoplasm of breast: Secondary | ICD-10-CM

## 2023-03-10 ENCOUNTER — Ambulatory Visit: Payer: 59

## 2023-04-14 ENCOUNTER — Ambulatory Visit
Admission: RE | Admit: 2023-04-14 | Discharge: 2023-04-14 | Disposition: A | Discharge status: Home / Self Care | Payer: 59 | Source: Ambulatory Visit | Attending: Family Medicine | Admitting: Family Medicine

## 2023-04-14 DIAGNOSIS — Z1231 Encounter for screening mammogram for malignant neoplasm of breast: Secondary | ICD-10-CM
